# Patient Record
Sex: Male | Born: 1975 | Race: Black or African American | Hispanic: No | Marital: Single | Smoking: Former smoker
Health system: Southern US, Community
[De-identification: ages and names within clinical notes are randomized; demographics above are authoritative.]

## PROBLEM LIST (undated history)

## (undated) HISTORY — PX: ORCHIECTOMY: SHX2116

---

## 2011-06-09 ENCOUNTER — Ambulatory Visit: Payer: Self-pay | Admitting: Internal Medicine

## 2012-06-17 ENCOUNTER — Ambulatory Visit: Payer: Self-pay | Admitting: Emergency Medicine

## 2012-06-19 ENCOUNTER — Ambulatory Visit: Payer: Self-pay | Admitting: Family Medicine

## 2012-06-20 ENCOUNTER — Ambulatory Visit: Payer: Self-pay

## 2013-06-19 ENCOUNTER — Ambulatory Visit: Payer: Self-pay | Admitting: Emergency Medicine

## 2013-06-24 ENCOUNTER — Ambulatory Visit: Payer: Self-pay | Admitting: Family Medicine

## 2013-11-17 ENCOUNTER — Ambulatory Visit: Payer: Self-pay

## 2014-05-11 ENCOUNTER — Ambulatory Visit
Admit: 2014-05-11 | Disposition: A | Discharge status: Home / Self Care | Payer: Self-pay | Attending: Family Medicine | Admitting: Family Medicine

## 2014-10-25 IMAGING — CR DG SHOULDER 3+V*L*
1 series · 3 of 3 positions shown · non-contrast
Comparison: None.

CLINICAL DATA: Atraumatic left posterior scapular pain.

EXAM:
DG SHOULDER 3+VIEWS LEFT

[Series 1: grashey · 0.17mm/px · 3 of 3 slices shown]
[im 1/3]
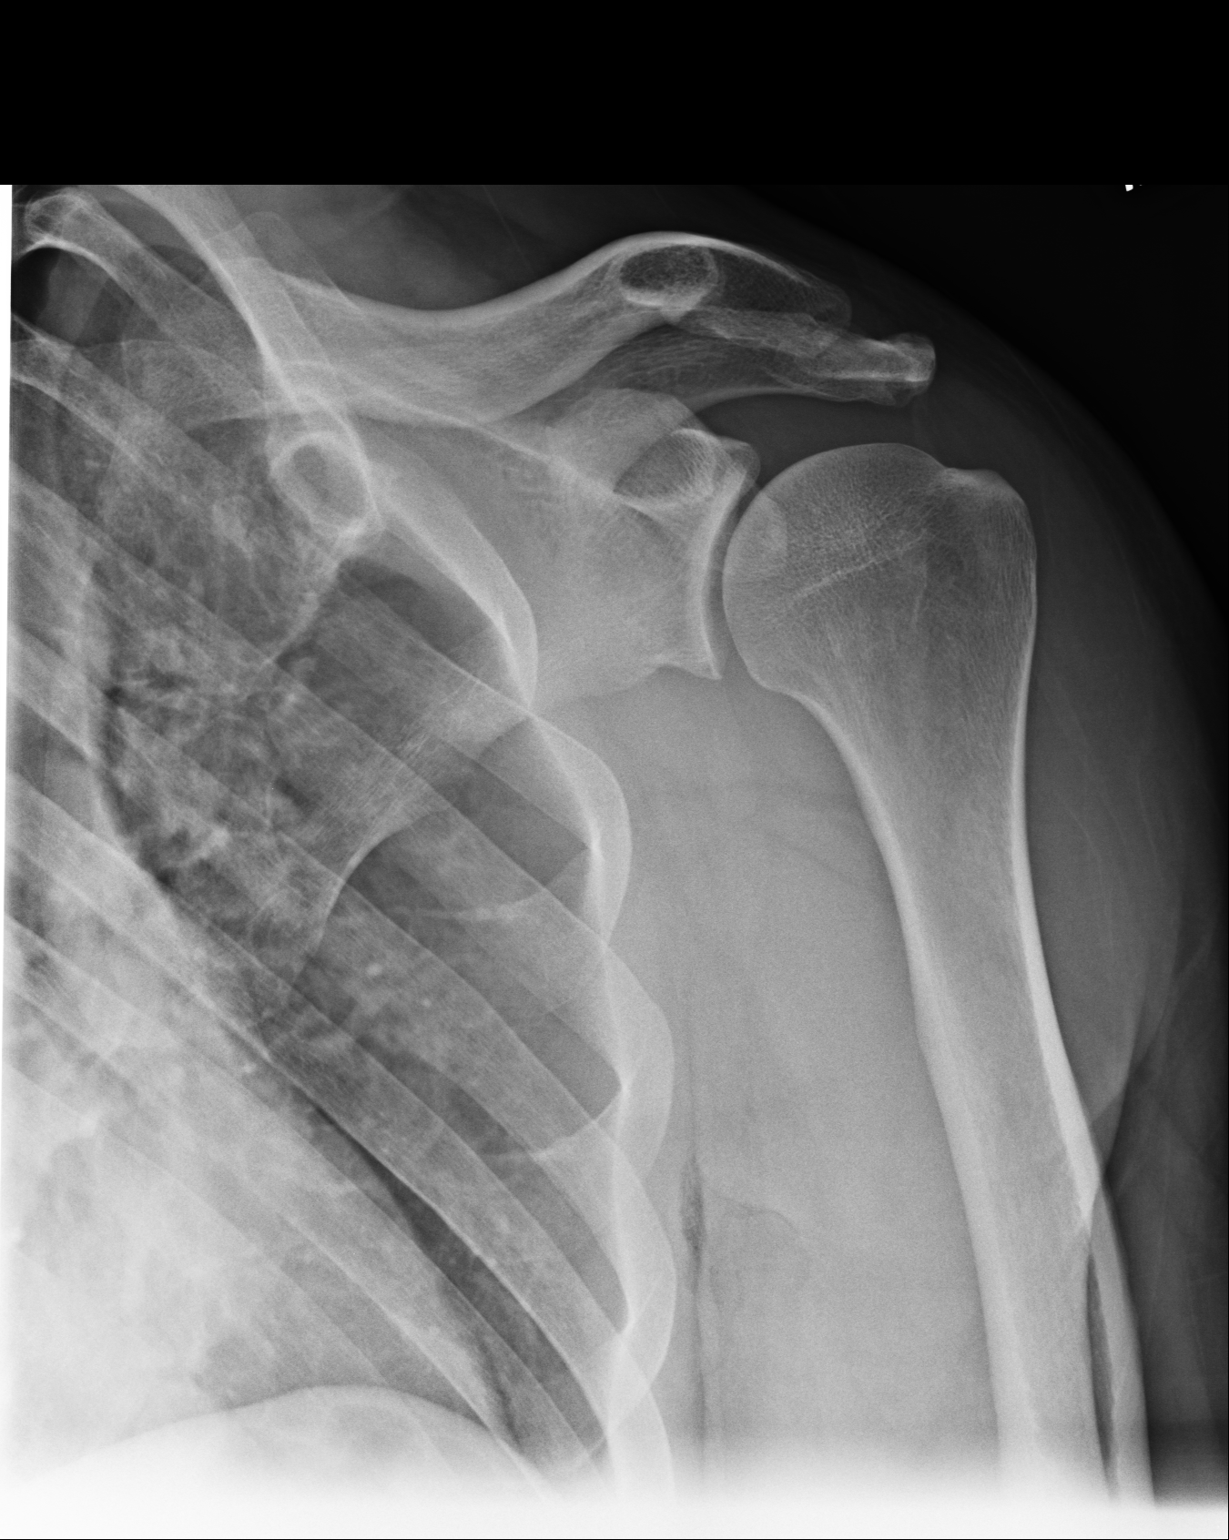
[im 2/3]
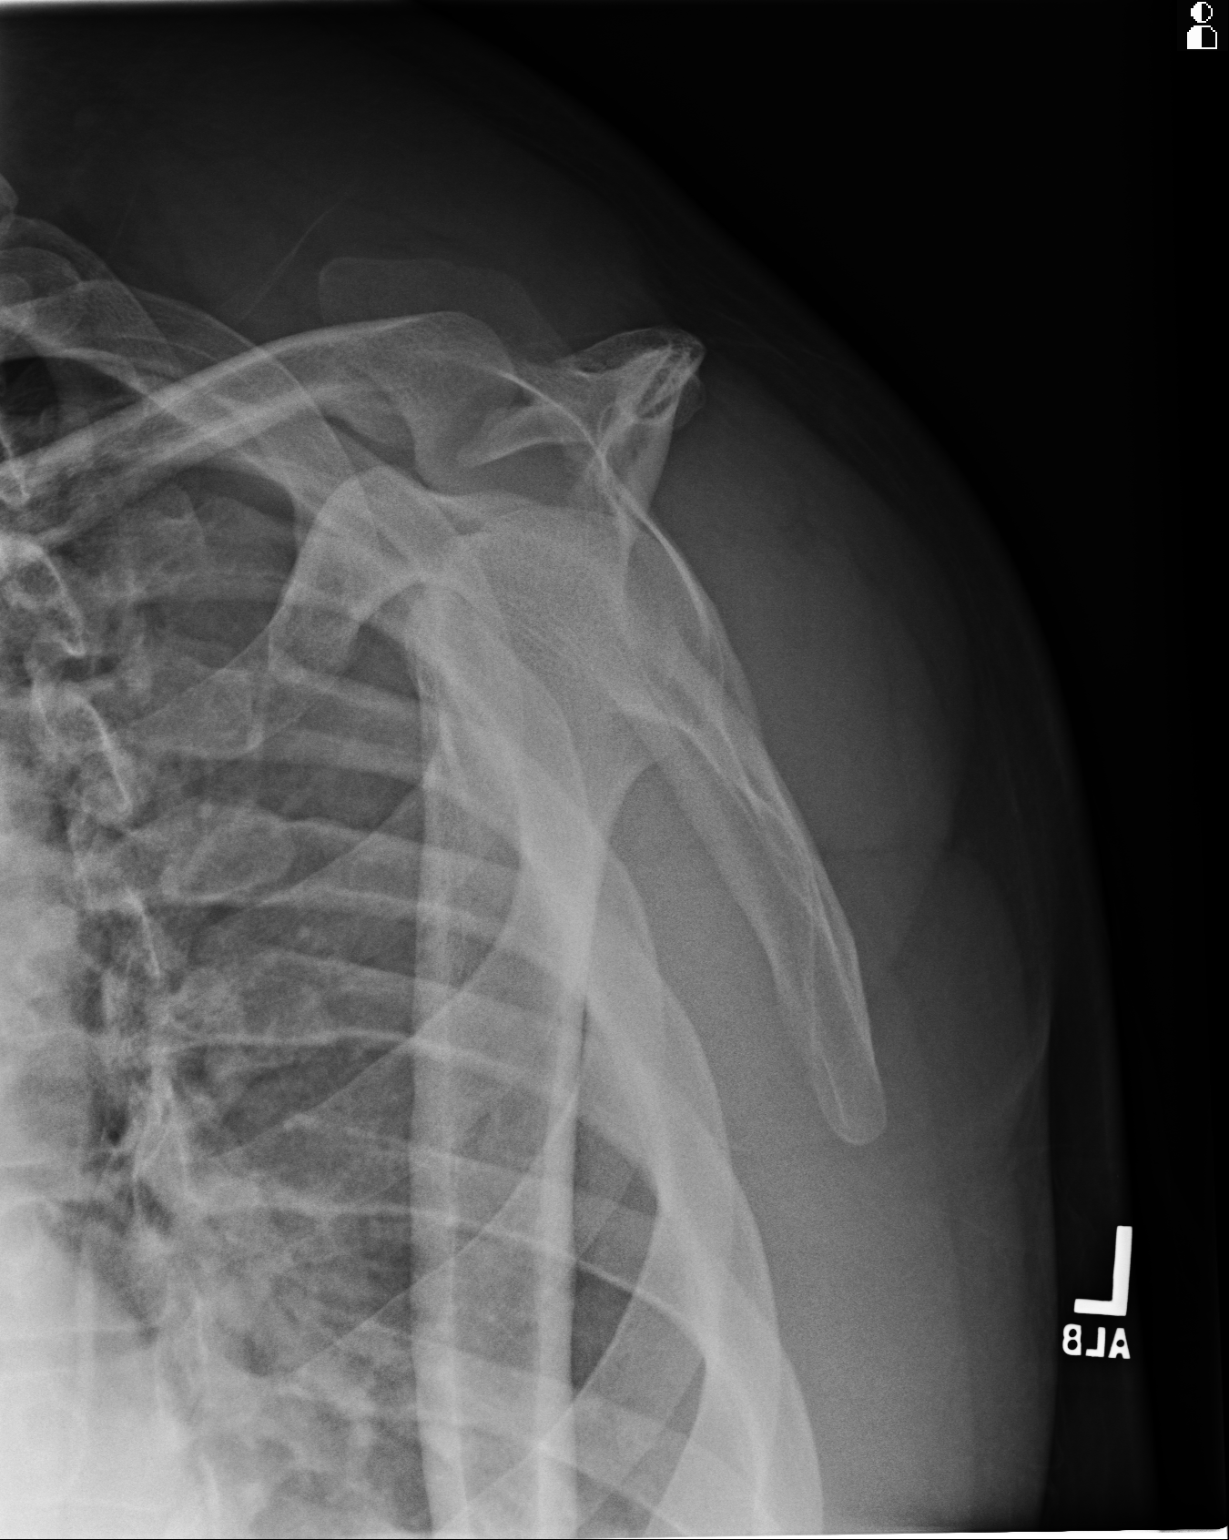
[im 3/3]
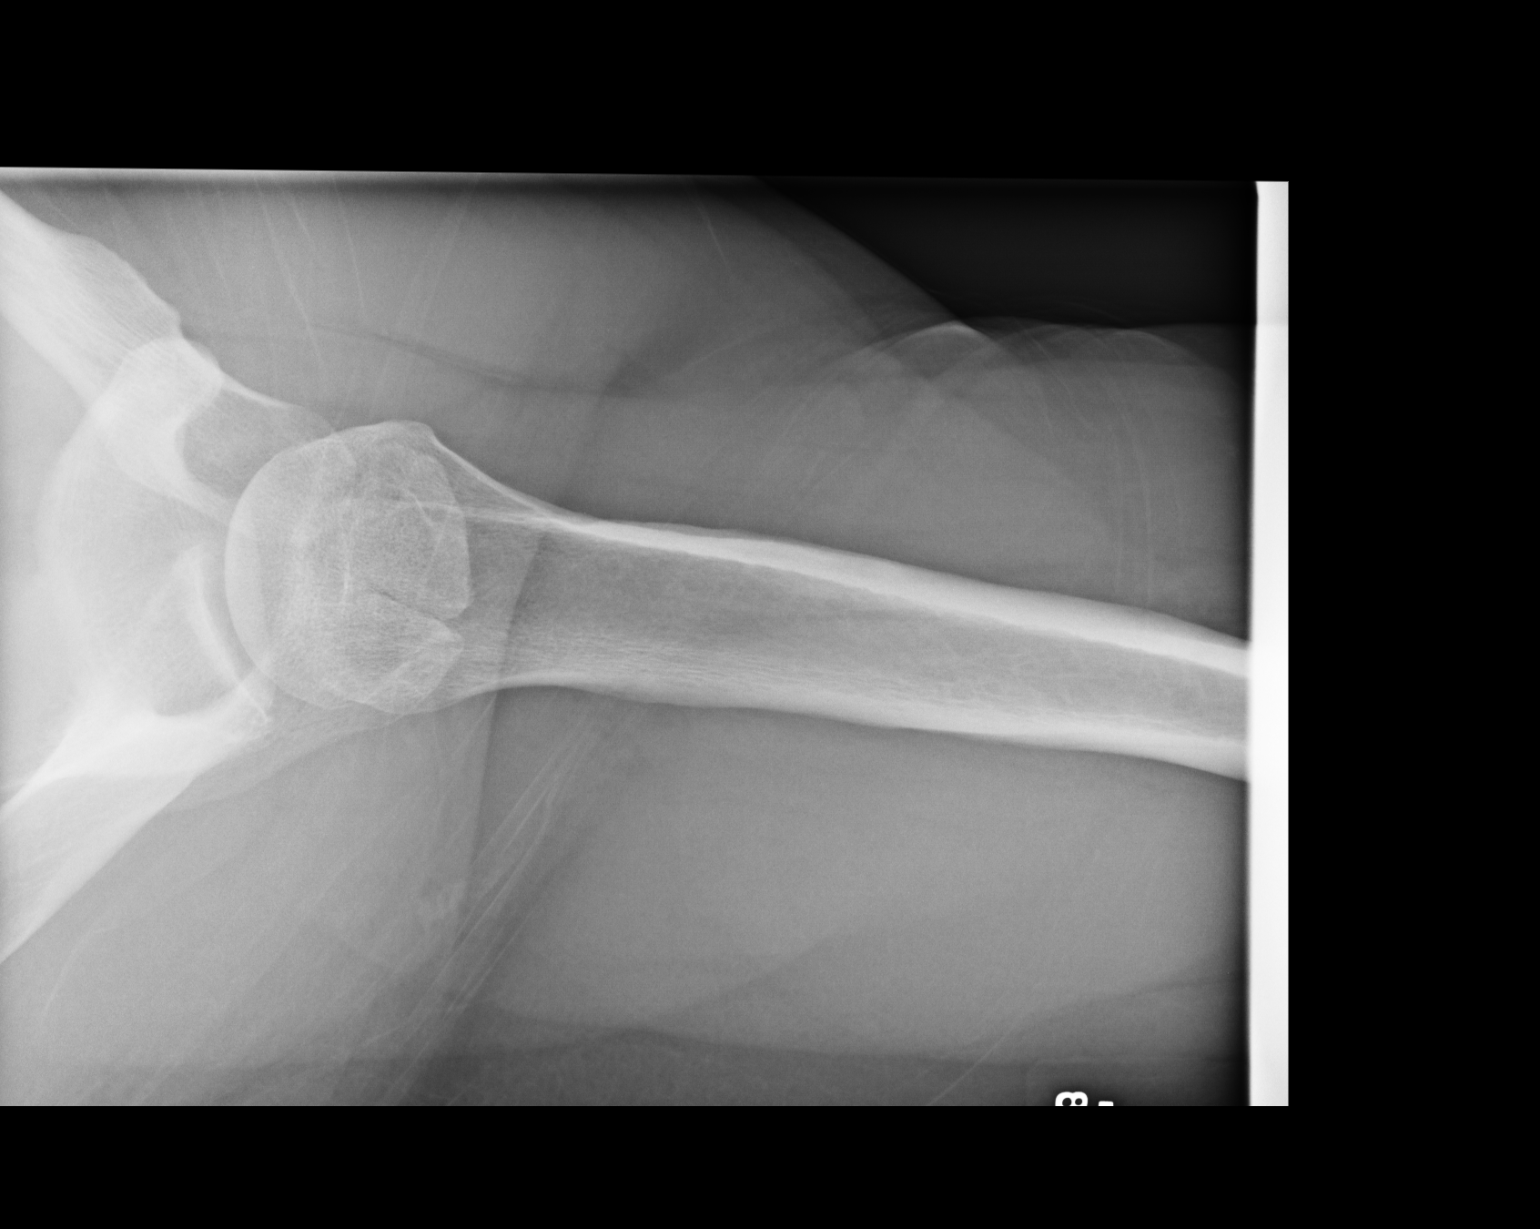

[3 of 3 positions shown; findings below may reference images not displayed]

FINDINGS: The glenohumeral joint and AC joint are normal. The body of the
scapula is intact. The observed portions of the left clavicle and
upper left ribs are normal.
IMPRESSION: There is no acute bony abnormality of the left shoulder.

## 2014-12-31 ENCOUNTER — Encounter: Payer: Self-pay | Admitting: Internal Medicine

## 2014-12-31 DIAGNOSIS — D485 Neoplasm of uncertain behavior of skin: Secondary | ICD-10-CM | POA: Insufficient documentation

## 2018-11-25 ENCOUNTER — Other Ambulatory Visit: Payer: Self-pay

## 2018-11-25 ENCOUNTER — Ambulatory Visit
Admission: EM | Admit: 2018-11-25 | Discharge: 2018-11-25 | Disposition: A | Discharge status: Home / Self Care | Payer: PRIVATE HEALTH INSURANCE | Attending: Family Medicine | Admitting: Family Medicine

## 2018-11-25 DIAGNOSIS — R05 Cough: Secondary | ICD-10-CM | POA: Diagnosis not present

## 2018-11-25 DIAGNOSIS — B349 Viral infection, unspecified: Secondary | ICD-10-CM | POA: Diagnosis not present

## 2018-11-25 DIAGNOSIS — J029 Acute pharyngitis, unspecified: Secondary | ICD-10-CM

## 2018-11-25 DIAGNOSIS — R197 Diarrhea, unspecified: Secondary | ICD-10-CM

## 2018-11-25 DIAGNOSIS — R0981 Nasal congestion: Secondary | ICD-10-CM | POA: Diagnosis not present

## 2018-11-25 MED ORDER — IPRATROPIUM BROMIDE 0.06 % NA SOLN: 2.0000 | Freq: Four times a day (QID) | NASAL | 0 refills | Status: DC | PRN

## 2018-11-25 MED ORDER — BENZONATATE 200 MG PO CAPS: 200.0000 mg | ORAL_CAPSULE | Freq: Three times a day (TID) | ORAL | 0 refills | Status: DC | PRN

## 2018-11-25 NOTE — ED Triage Notes (Addendum)
Reports since Saturday he has had productive yellow cough, nasal congestion/drainage, sore throat and diarrhea after eating.  Denies any confirmed/suspected positive COVID contacts. Pt speaks in full and complete sentences without difficulty. Pt alert and oriented X4, cooperative, RR even and unlabored, color WNL. Pt in NAD.

## 2018-11-25 NOTE — ED Provider Notes (Signed)
[facility]    CSN: [id number] Arrival date & time: 11/25/18  [id number]  History   Chief Complaint Chief Complaint  Patient presents with  . Cough  . Nasal Congestion   HPI  43 year old presents with respiratory symptoms.  Patient reports that he has been sick since Saturday.  He reports productive cough, congestion, rhinorrhea, sore throat, and diarrhea.  Denies sick contacts.  No medications or interventions tried.  He is currently in mild discomfort.  Rates his pain as 2/10 in severity.  Denies shortness of breath.  Denies fever.  No known exacerbating factors.  No other reported symptoms.  No other complaints.  PMH, Surgical Hx, Family Hx, Social History reviewed and updated as below.  PMH: Patient Active Problem List   Diagnosis Date Noted  . 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 ORCHIECTOMY Left     Home Medications    Prior to Admission medications   Medication Sig Start Date End Date Taking? Authorizing Provider  benzonatate (TESSALON) 200 MG capsule Take 1 capsule (200 mg total) by mouth 3 (three) times daily as needed for cough. 11/25/18   Thersa Salt G, DO  ipratropium (ATROVENT) 0.06 % nasal spray Place 2 sprays into both nostrils 4 (four) times daily as needed for rhinitis. 11/25/18   Coral Spikes, DO   Family History Family History  Problem Relation Age of Onset  . Sarcoidosis Mother    Social History Social History   Tobacco Use  . Smoking status: Former Smoker  Substance Use Topics  . Alcohol use: Yes    Alcohol/week: 21.0 standard drinks    Types: 21 Standard drinks or equivalent per week  . Drug use: Not on file   Allergies   Penicillins   Review of Systems Review of Systems  Constitutional: Negative for fever.  HENT: Positive for congestion, rhinorrhea and sore throat.   Respiratory: Positive for cough. Negative for shortness of breath.   Gastrointestinal:  Positive for diarrhea.   Physical Exam Triage Vital Signs ED Triage Vitals [11/25/18 0932]  Enc Vitals Group     BP 123/85     Pulse Rate 79     Resp 18     Temp 99.1 F (37.3 C)     Temp Source Oral     SpO2 98 %     Weight 225 lb (102.1 kg)     Height 5\' 11"  (1.803 m)     Head Circumference      Peak Flow      Pain Score 2     Pain Loc      Pain Edu?      Excl. in [place]?    Updated Vital Signs BP 123/85 (BP Location: Left Arm)   Pulse 79   Temp 99.1 F (37.3 C) (Oral)   Resp 18   Ht 5\' 11"  (1.803 m)   Wt 102.1 kg   SpO2 98%   BMI 31.38 kg/m   Visual Acuity Right Eye Distance:   Left Eye Distance:   Bilateral Distance:    Right Eye Near:   Left Eye Near:    Bilateral Near:     Physical Exam Vitals signs and nursing note reviewed.  Constitutional:      General: He is not in acute distress.    Appearance: Normal appearance. He is obese. He is not ill-appearing.  HENT:     Head: Normocephalic and  atraumatic.     Mouth/Throat:     Pharynx: Oropharynx is clear. No posterior oropharyngeal erythema.  Eyes:     General:        Right eye: No discharge.        Left eye: No discharge.     Conjunctiva/sclera: Conjunctivae normal.  Cardiovascular:     Rate and Rhythm: Normal rate and regular rhythm.  Pulmonary:     Effort: Pulmonary effort is normal.     Breath sounds: Wheezing present.  Neurological:     Mental Status: He is alert.  Psychiatric:        Mood and Affect: Mood normal.        Behavior: Behavior normal.    UC Treatments / Results  Labs (all labs ordered are listed, but only abnormal results are displayed) Labs Reviewed  NOVEL CORONAVIRUS, NAA (HOSP ORDER, SEND-OUT TO REF LAB; TAT 18-24 HRS)    EKG   Radiology No results found.  Procedures Procedures (including critical care time)  Medications Ordered in UC Medications - No data to display  Initial Impression / Assessment and Plan / UC Course  I have reviewed the triage vital signs  and the nursing notes.  Pertinent labs & imaging results that were available during my care of the patient were reviewed by me and considered in my medical decision making (see chart for details).    43 year old male presents with a suspected viral illness.  Awaiting Covid test results.  Treating symptomatically with Tessalon Perles and Atrovent nasal spray.  Work note given.  Supportive care.  Final Clinical Impressions(s) / UC Diagnoses   Final diagnoses:  Viral illness     Discharge Instructions     Awaiting COVID test results.  Medications as directed.  Ibuprofen for pain.  Take care  Dr. Lacinda Axon    ED Prescriptions    Medication Sig Dispense Auth. Provider   benzonatate (TESSALON) 200 MG capsule Take 1 capsule (200 mg total) by mouth 3 (three) times daily as needed for cough. 20 capsule Dazhane Villagomez G, DO   ipratropium (ATROVENT) 0.06 % nasal spray Place 2 sprays into both nostrils 4 (four) times daily as needed for rhinitis. 15 mL Coral Spikes, DO     PDMP not reviewed this encounter.   Coral Spikes, [place] 11/25/18 1233

## 2018-11-25 NOTE — Discharge Instructions (Signed)
Awaiting COVID test results.  Medications as directed.  Ibuprofen for pain.  Take care  Dr. Lacinda Axon

## 2018-11-26 LAB — NOVEL CORONAVIRUS, NAA (HOSP ORDER, SEND-OUT TO REF LAB; TAT 18-24 HRS): SARS-CoV-2, NAA: NOT DETECTED

## 2022-06-06 ENCOUNTER — Other Ambulatory Visit: Payer: Self-pay

## 2022-06-06 ENCOUNTER — Emergency Department: Payer: 59

## 2022-06-06 ENCOUNTER — Observation Stay
Admission: EM | Admit: 2022-06-06 | Discharge: 2022-06-07 | Disposition: A | Discharge status: Home / Self Care | Payer: 59 | Attending: Internal Medicine | Admitting: Internal Medicine

## 2022-06-06 DIAGNOSIS — T50901A Poisoning by unspecified drugs, medicaments and biological substances, accidental (unintentional), initial encounter: Secondary | ICD-10-CM

## 2022-06-06 DIAGNOSIS — F1990 Other psychoactive substance use, unspecified, uncomplicated: Secondary | ICD-10-CM | POA: Insufficient documentation

## 2022-06-06 DIAGNOSIS — Z87891 Personal history of nicotine dependence: Secondary | ICD-10-CM | POA: Insufficient documentation

## 2022-06-06 DIAGNOSIS — T189XXA Foreign body of alimentary tract, part unspecified, initial encounter: Secondary | ICD-10-CM | POA: Insufficient documentation

## 2022-06-06 DIAGNOSIS — Z79899 Other long term (current) drug therapy: Secondary | ICD-10-CM | POA: Insufficient documentation

## 2022-06-06 DIAGNOSIS — D751 Secondary polycythemia: Secondary | ICD-10-CM | POA: Diagnosis not present

## 2022-06-06 DIAGNOSIS — R9431 Abnormal electrocardiogram [ECG] [EKG]: Secondary | ICD-10-CM | POA: Diagnosis not present

## 2022-06-06 DIAGNOSIS — Z0389 Encounter for observation for other suspected diseases and conditions ruled out: Secondary | ICD-10-CM | POA: Diagnosis not present

## 2022-06-06 DIAGNOSIS — T50904A Poisoning by unspecified drugs, medicaments and biological substances, undetermined, initial encounter: Secondary | ICD-10-CM | POA: Diagnosis not present

## 2022-06-06 LAB — URINE DRUG SCREEN, QUALITATIVE (ARMC ONLY)
Amphetamines, Ur Screen: NOT DETECTED
Barbiturates, Ur Screen: NOT DETECTED
Benzodiazepine, Ur Scrn: NOT DETECTED
Cannabinoid 50 Ng, Ur ~~LOC~~: POSITIVE — AB
Cocaine Metabolite,Ur ~~LOC~~: POSITIVE — AB
MDMA (Ecstasy)Ur Screen: NOT DETECTED
Methadone Scn, Ur: NOT DETECTED
Opiate, Ur Screen: NOT DETECTED
Phencyclidine (PCP) Ur S: NOT DETECTED
Tricyclic, Ur Screen: NOT DETECTED

## 2022-06-06 LAB — CBC WITH DIFFERENTIAL/PLATELET
Abs Immature Granulocytes: 0.05 10*3/uL (ref 0.00–0.07)
Basophils Absolute: 0.1 10*3/uL (ref 0.0–0.1)
Basophils Relative: 1 %
Eosinophils Absolute: 0.5 10*3/uL (ref 0.0–0.5)
Eosinophils Relative: 6 %
HCT: 53.7 % — ABNORMAL HIGH (ref 39.0–52.0)
Hemoglobin: 18.6 g/dL — ABNORMAL HIGH (ref 13.0–17.0)
Immature Granulocytes: 1 %
Lymphocytes Relative: 24 %
Lymphs Abs: 2 10*3/uL (ref 0.7–4.0)
MCH: 29.2 pg (ref 26.0–34.0)
MCHC: 34.6 g/dL (ref 30.0–36.0)
MCV: 84.2 fL (ref 80.0–100.0)
Monocytes Absolute: 0.7 10*3/uL (ref 0.1–1.0)
Monocytes Relative: 8 %
Neutro Abs: 5 10*3/uL (ref 1.7–7.7)
Neutrophils Relative %: 60 %
Platelets: 269 10*3/uL (ref 150–400)
RBC: 6.38 MIL/uL — ABNORMAL HIGH (ref 4.22–5.81)
RDW: 13.8 % (ref 11.5–15.5)
WBC: 8.3 10*3/uL (ref 4.0–10.5)
nRBC: 0 % (ref 0.0–0.2)

## 2022-06-06 LAB — BASIC METABOLIC PANEL
Anion gap: 11 (ref 5–15)
BUN: 15 mg/dL (ref 6–20)
CO2: 22 mmol/L (ref 22–32)
Calcium: 9 mg/dL (ref 8.9–10.3)
Chloride: 103 mmol/L (ref 98–111)
Creatinine, Ser: 1.15 mg/dL (ref 0.61–1.24)
GFR, Estimated: >60 mL/min (ref >60)
Glucose, Bld: 110 mg/dL — ABNORMAL HIGH (ref 70–99)
Potassium: 3.8 mmol/L (ref 3.5–5.1)
Sodium: 136 mmol/L (ref 135–145)

## 2022-06-06 NOTE — ED Provider Notes (Signed)
Shared visit.  Patient arrived via PD with concern for swallowing a bag of possible crack or marijuana.  Here for medical clearance to jail.  Patient afebrile and hemodynamically stable.  X-ray imaging without foreign body.  EKG within normal limits.  UDS obtained.  Consulted and discussed with poison control who recommended 24-hour observation on telemetry.  Patient without SI or HI.  Full capacity without any psychiatric issues, do not feel that patient needs an evaluation by psychiatry at this time.  Consulted hospitalist for admission for ingestion.   Corena Herter, MD 06/06/22 1728

## 2022-06-06 NOTE — ED Notes (Signed)
Poison Control called to assess if any changes. Reported no current changes. Poison Control advised to call back if change in condition.

## 2022-06-06 NOTE — Assessment & Plan Note (Signed)
Unfortunately, patient is being monitored by police and is not able to tell us freely what he ingested or if anything at all.  At this time patient will be maintained on telemetry, I await urine toxicology screen, I will put in for neurochecks every 4 hourly.  At this time patient is clinically looking great.

## 2022-06-06 NOTE — H&P (Signed)
History and Physical    Patient: Eric Wiggins WJX:[id number] DOB: Nov 25, 1975 DOA: 06/06/2022 DOS: the patient was seen and examined on 06/06/2022 PCP: Patient, No Pcp Per  Patient coming from:  Brought in by police  Chief Complaint:  Chief Complaint  Patient presents with   Ingestion   HPI: Sadiki Lollie is a 47 y.o. male with medical history significant of orchiectomy.  All history is obtained from secondary sources with regards to events prior to patient's arrival.  Patient is not disclosing to me anything about the circumstances of this day.  Patient is seen with an officer in the room.  Patient is actually under arrest at this time.  Apparently patient was arrested by police for concern/allegation of illicit/controlled drug possession including marijuana.  The arresting officer noted that the patient had something in his mouth that the patient subsequently chewed and swallowed before the police could possess it.  Speculation is raised that this may have been a drug.  Therefore patient is brought to [facility] ER for safety evaluation.  Patient again is not revealing any details of the ingestion.  Denies suicidal or immediate homicidal ideation.  Denies any chest pain shortness of breath nausea vomiting diarrhea headache or vision changes.  Patient is feeling pretty good. Review of Systems: As mentioned in the history of present illness. All other systems reviewed and are negative. No past medical history on file. Past Surgical History:  Procedure Laterality Date   ORCHIECTOMY Left    Social History:  reports that he has quit smoking. He does not have any smokeless tobacco history on file. He reports current alcohol use of about 21.0 standard drinks of alcohol per week. No history on file for drug use.  Allergies  Allergen Reactions   Penicillins Anaphylaxis    Family History  Problem Relation Age of Onset   Sarcoidosis Mother     Prior to Admission medications    Medication Sig Start Date End Date Taking? Authorizing Provider  benzonatate (TESSALON) 200 MG capsule Take 1 capsule (200 mg total) by mouth 3 (three) times daily as needed for cough. 11/25/18   Everlene Other G, DO  ipratropium (ATROVENT) 0.06 % nasal spray Place 2 sprays into both nostrils 4 (four) times daily as needed for rhinitis. 11/25/18   Tommie Sams, DO    Physical Exam: Vitals:   06/06/22 1331 06/06/22 1332 06/06/22 1822  BP:  (!) 153/99 104/83  Pulse:  94 84  Resp:  18 17  Temp:  98.5 F (36.9 C) 98.3 F (36.8 C)  TempSrc:  Oral Oral  SpO2:  95% 97%  Weight: 102.1 kg    Height: 5\' 11"  (1.803 m)     General: Alert awake oriented x 3 no focal motor deficit no distress Respiratory exam: Bilateral intravesicular Cardiovascular exam S1-S2 normal Abdomen soft nontender Extremities warm without edema.  Officer present in room during this encounter at all times Data Reviewed:  Labs on Admission:  Results for orders placed or performed during the hospital encounter of 06/06/22 (from the past 24 hour(s))  Basic metabolic panel     Status: Abnormal   Collection Time: 06/06/22  1:41 PM  Result Value Ref Range   Sodium 136 135 - 145 mmol/L   Potassium 3.8 3.5 - 5.1 mmol/L   Chloride 103 98 - 111 mmol/L   CO2 22 22 - 32 mmol/L   Glucose, Bld 110 (H) 70 - 99 mg/dL   BUN 15 6 - 20 mg/dL  Creatinine, Ser 1.15 0.61 - 1.24 mg/dL   Calcium 9.0 8.9 - 16.1 mg/dL   GFR, Estimated >09 >60 mL/min   Anion gap 11 5 - 15  CBC with Differential     Status: Abnormal   Collection Time: 06/06/22  1:41 PM  Result Value Ref Range   WBC 8.3 4.0 - 10.5 K/uL   RBC 6.38 (H) 4.22 - 5.81 MIL/uL   Hemoglobin 18.6 (H) 13.0 - 17.0 g/dL   HCT 45.4 (H) 09.8 - 11.9 %   MCV 84.2 80.0 - 100.0 fL   MCH 29.2 26.0 - 34.0 pg   MCHC 34.6 30.0 - 36.0 g/dL   RDW 14.7 82.9 - 56.2 %   Platelets 269 150 - 400 K/uL   nRBC 0.0 0.0 - 0.2 %   Neutrophils Relative % 60 %   Neutro Abs 5.0 1.7 - 7.7 K/uL    Lymphocytes Relative 24 %   Lymphs Abs 2.0 0.7 - 4.0 K/uL   Monocytes Relative 8 %   Monocytes Absolute 0.7 0.1 - 1.0 K/uL   Eosinophils Relative 6 %   Eosinophils Absolute 0.5 0.0 - 0.5 K/uL   Basophils Relative 1 %   Basophils Absolute 0.1 0.0 - 0.1 K/uL   Immature Granulocytes 1 %   Abs Immature Granulocytes 0.05 0.00 - 0.07 K/uL   Basic Metabolic Panel: Recent Labs  Lab 06/06/22 1341  NA 136  K 3.8  CL 103  CO2 22  GLUCOSE 110*  BUN 15  CREATININE 1.15  CALCIUM 9.0   Liver Function Tests: No results for input(s): "AST", "ALT", "ALKPHOS", "BILITOT", "PROT", "ALBUMIN" in the last 168 hours. No results for input(s): "LIPASE", "AMYLASE" in the last 168 hours. No results for input(s): "AMMONIA" in the last 168 hours. CBC: Recent Labs  Lab 06/06/22 1341  WBC 8.3  NEUTROABS 5.0  HGB 18.6*  HCT 53.7*  MCV 84.2  PLT 269   Cardiac Enzymes: No results for input(s): "CKTOTAL", "CKMB", "CKMBINDEX", "TROPONINIHS" in the last 168 hours.  BNP (last 3 results) No results for input(s): "PROBNP" in the last 8760 hours. CBG: No results for input(s): "GLUCAP" in the last 168 hours.  Radiological Exams on Admission:  DG Abdomen 1 View  Result Date: 06/06/2022 CLINICAL DATA:  Possible ingestion of drugs. EXAM: ABDOMEN - 1 VIEW COMPARISON:  None Available. FINDINGS: The bowel gas pattern is normal. No radio-opaque calculi or other significant radiographic abnormality are seen. No radiopaque foreign body is noted. IMPRESSION: No definite radiopaque foreign body is noted. Electronically Signed   By: Lupita Raider M.D.   On: 06/06/2022 15:47    EKG: Independently reviewed. NSR   Assessment and Plan: * Ingestion of foreign body Unfortunately, patient is being monitored by police and is not able to tell us freely what he ingested or if anything at all.  At this time patient will be maintained on telemetry, I await urine toxicology screen, I will put in for neurochecks every 4  hourly.  At this time patient is clinically looking great.  Erythrocytosis Check erythropoietin level.  Check LFTs      Advance Care Planning:   Code Status: Full Code   Consults: none  Family Communication: per patient  Severity of Illness: The appropriate patient status for this patient is OBSERVATION. Observation status is judged to be reasonable and necessary in order to provide the required intensity of service to ensure the patient's safety. The patient's presenting symptoms, physical exam findings, and initial radiographic  and laboratory data in the context of their medical condition is felt to place them at decreased risk for further clinical deterioration. Furthermore, it is anticipated that the patient will be medically stable for discharge from the hospital within 2 midnights of admission.   Author: Nolberto Hanlon, MD 06/06/2022 8:20 PM  For on call review [email or web address].

## 2022-06-06 NOTE — ED Provider Notes (Cosign Needed Addendum)
[facility] Provider Note     Event Date/Time   First MD Initiated Contact with Patient 06/06/22 1505     (approximate)   History   Ingestion   HPI  06/06/22 1505: Patient somnolent and unable to provide HPI. He arouses to verbal stimuli, but is incoherent during conversation.  Eric Wiggins is a 47 y.o. male presents to the ED in custody of [employer] for suspected ingestion.  Patient is suspected to have swallowed a (bag) of an illegal substance, during a traffic stop. I spoke with the arresting officer via phone. The patient was witnessed to have a lump in his cheek, he began to swallow the  [employer] believes it was possibly a bag of crack, however the patient states it was just a bag of marijuana.  Patient presents in no acute distress for medical clearance.  ----------------------------------------- 4:41 PM on 06/06/2022 ----------------------------------------- Interim evaluation: patient more alert and interactive at this time.  I asked the [employer] officer to step out: of the exam room. The patient admitted to smoking crack cocaine and weed about 2 hours prior to contact with [employer]. He denies, to me, swallowing any drug products.   Physical Exam   Triage Vital Signs: ED Triage Vitals  Enc Vitals Group     BP 06/06/22 1332 (!) 153/99     Pulse Rate 06/06/22 1332 94     Resp 06/06/22 1332 18     Temp 06/06/22 1332 98.5 F (36.9 C)     Temp Source 06/06/22 1332 Oral     SpO2 06/06/22 1332 95 %     Weight 06/06/22 1331 225 lb 1.4 oz (102.1 kg)     Height 06/06/22 1331 5\' 11"  (1.803 m)     Head Circumference --      Peak Flow --      Pain Score 06/06/22 1331 0     Pain Loc --      Pain Edu? --      Excl. in [place]? --     Most recent vital signs: Vitals:   06/06/22 1332 06/06/22 1822  BP: (!) 153/99 104/83  Pulse: 94 84  Resp: 18 17  Temp: 98.5 F (36.9 C) 98.3 F (36.8 C)  SpO2: 95% 97%    General Awake, no distress.   HEENT NCAT. PERRL. EOMI. No rhinorrhea. Mucous membranes are moist.  CV:  Good peripheral perfusion. RRR RESP:  Normal effort. CTA ABD:  No distention. Soft, nontender. Normal bowel sounds   ED Results / Procedures / Treatments   Labs (all labs ordered are listed, but only abnormal results are displayed) Labs Reviewed  BASIC METABOLIC PANEL - Abnormal; Notable for the following components:      Result Value   Glucose, Bld 110 (*)    All other components within normal limits  CBC WITH DIFFERENTIAL/PLATELET - Abnormal; Notable for the following components:   RBC 6.38 (*)    Hemoglobin 18.6 (*)    HCT 53.7 (*)    All other components within normal limits  URINE DRUG SCREEN, QUALITATIVE ([facility] ONLY)     EKG  Vent. rate 94 BPM PR interval 172 ms QRS duration 74 ms QT/QTcB 354/442 ms P-R-T axes 81 64 16 Normal sinus rhythm Nonspecific T wave abnormality No STEMI  RADIOLOGY  I personally viewed and evaluated these images as part of my medical decision making, as well as reviewing the written report by the radiologist.  ED Provider Interpretation:  no acute radiopaque abnormalities  DG Abdomen 1 View  Result Date: 06/06/2022 CLINICAL DATA:  Possible ingestion of drugs. EXAM: ABDOMEN - 1 VIEW COMPARISON:  None Available. FINDINGS: The bowel gas pattern is normal. No radio-opaque calculi or other significant radiographic abnormality are seen. No radiopaque foreign body is noted. IMPRESSION: No definite radiopaque foreign body is noted. Electronically Signed   By: Lupita Raider M.D.   On: 06/06/2022 15:47     PROCEDURES:  Critical Care performed: No  Procedures   MEDICATIONS ORDERED IN ED: Medications - No data to display   IMPRESSION / MDM / ASSESSMENT AND PLAN / ED COURSE  I reviewed the triage vital signs and the nursing notes.                             Differential diagnosis includes, but is not limited to, illicit drug use, ingestion of an unknown drug,  electrolyte abnormality, dehydration, infection  Patient's presentation is most consistent with acute complicated illness / injury requiring diagnostic workup.  ----------------------------------------- 4:57 PM on 06/06/2022 ----------------------------------------- S/W Danelle Earthly, RN @ [facility]  She suggests 24 hours OBS with telemetry and with appropriate use of BZD/naloxone/charcoal, as indicated Monitor bowel movement, if appropriate Await UDS  ----------------------------------------- 7:18 PM on 06/06/2022 ----------------------------------------- S/W  Dr. Regina Eck: he will admit the patient to the hospitalist service as recommended.   Patient's diagnosis is consistent with suspected ingestion of illicit substance. Patient will be admitted to the hospital service for observation.  Patient is understandable and agreeable to the plan of admission at this time.  [employer] officer at bedside, who is the Teacher, music is also aware of the plan at this time.  Patient remained under [employer] custody while in admission/observation status.    FINAL CLINICAL IMPRESSION(S) / ED DIAGNOSES   Final diagnoses:  Ingestion of unknown drug, undetermined intent, initial encounter     Rx / DC Orders   ED Discharge Orders     None        Note:  This document was prepared using Dragon voice recognition software and may include unintentional dictation errors.    Lissa Hoard, PA-C 06/06/22 1926    Lissa Hoard, [place] 06/06/22 1952    Merwyn Katos, MD 06/07/22 [id number]

## 2022-06-06 NOTE — Assessment & Plan Note (Signed)
Check erythropoietin level.  Check LFTs

## 2022-06-06 NOTE — ED Triage Notes (Addendum)
Pt here with [employer] with an ingestion. Pt swallowed a bag of possible crack, pt states it was marijuana. Pt is here for medical clearance.

## 2022-06-07 DIAGNOSIS — F1990 Other psychoactive substance use, unspecified, uncomplicated: Secondary | ICD-10-CM | POA: Diagnosis not present

## 2022-06-07 LAB — CBC
HCT: 53.4 % — ABNORMAL HIGH (ref 39.0–52.0)
Hemoglobin: 18.1 g/dL — ABNORMAL HIGH (ref 13.0–17.0)
MCH: 28.7 pg (ref 26.0–34.0)
MCHC: 33.9 g/dL (ref 30.0–36.0)
MCV: 84.8 fL (ref 80.0–100.0)
Platelets: 234 10*3/uL (ref 150–400)
RBC: 6.3 MIL/uL — ABNORMAL HIGH (ref 4.22–5.81)
RDW: 13.9 % (ref 11.5–15.5)
WBC: 8.3 10*3/uL (ref 4.0–10.5)
nRBC: 0 % (ref 0.0–0.2)

## 2022-06-07 LAB — BASIC METABOLIC PANEL
Anion gap: 6 (ref 5–15)
BUN: 14 mg/dL (ref 6–20)
CO2: 23 mmol/L (ref 22–32)
Calcium: 8.7 mg/dL — ABNORMAL LOW (ref 8.9–10.3)
Chloride: 108 mmol/L (ref 98–111)
Creatinine, Ser: 1.05 mg/dL (ref 0.61–1.24)
GFR, Estimated: >60 mL/min (ref >60)
Glucose, Bld: 96 mg/dL (ref 70–99)
Potassium: 4 mmol/L (ref 3.5–5.1)
Sodium: 137 mmol/L (ref 135–145)

## 2022-06-07 LAB — HEPATIC FUNCTION PANEL
ALT: 24 U/L (ref 0–44)
AST: 22 U/L (ref 15–41)
Albumin: 4 g/dL (ref 3.5–5.0)
Alkaline Phosphatase: 64 U/L (ref 38–126)
Bilirubin, Direct: 0.2 mg/dL (ref 0.0–0.2)
Indirect Bilirubin: 1.1 mg/dL — ABNORMAL HIGH (ref 0.3–0.9)
Total Bilirubin: 1.3 mg/dL — ABNORMAL HIGH (ref 0.3–1.2)
Total Protein: 7.1 g/dL (ref 6.5–8.1)

## 2022-06-07 LAB — APTT: aPTT: 29 seconds (ref 24–36)

## 2022-06-07 LAB — PROTIME-INR
INR: 1.1 (ref 0.8–1.2)
Prothrombin Time: 14.6 seconds (ref 11.4–15.2)

## 2022-06-07 LAB — HIV ANTIBODY (ROUTINE TESTING W REFLEX): HIV Screen 4th Generation wRfx: NONREACTIVE

## 2022-06-07 MED ORDER — ACETAMINOPHEN 325 MG PO TABS: 650.0000 mg | ORAL_TABLET | Freq: Four times a day (QID) | ORAL | Status: DC | PRN

## 2022-06-07 MED ORDER — ENOXAPARIN SODIUM 60 MG/0.6ML IJ SOSY
0.5000 mg/kg | PREFILLED_SYRINGE | INTRAMUSCULAR | Status: DC
  Filled 2022-06-07: qty 0.6

## 2022-06-07 MED ORDER — ACETAMINOPHEN 650 MG RE SUPP: 650.0000 mg | Freq: Four times a day (QID) | RECTAL | Status: DC | PRN

## 2022-06-07 NOTE — Discharge Summary (Signed)
  Physician Discharge Summary   Patient: Eric Wiggins MRN: [id number] DOB: 1975/08/18  Admit date:     06/06/2022  Discharge date: 06/07/22  Discharge Physician: Loyce Dys   PCP: Patient, No Pcp Per   Recommendations at discharge:  Avoidance of drug use  Discharge Diagnoses: Ingestion of foreign body thought to be due to illicit drug Polysubstance use Erythrocytosis  Hospital Course:   Lewell Berdecia is a 47 y.o. male with medical history significant of orchiectomy.  History was obtained from secondary sources with regards to events prior to patient's arrival.  Patient is not disclosing to anything about the circumstances.  Patient is seen with an officer in the room.  Patient is actually under arrest at this time.   Apparently patient was arrested by police for concern/allegation of illicit/controlled drug possession including marijuana.  The arresting officer noted that the patient had something in his mouth that the patient subsequently chewed and swallowed before the police could possess it.  Speculation is raised that this may have been a drug.  Urine drug screen was positive for cocaine as well as marijuana.  Patient currently has no complaints back to his baseline and therefore being discharged today as of office visit today.  Has been counseled on cessation of drug use  Consultants: None Procedures performed: None Disposition:  Discharged in the care of police officers Diet recommendation:  Discharge Diet Orders (From admission, onward)     Start     Ordered   06/07/22 0000  Diet - low sodium heart healthy        06/07/22 1202           Regular diet DISCHARGE MEDICATION: Allergies as of 06/07/2022       Reactions   Penicillins Anaphylaxis        Medication List     STOP taking these medications    benzonatate 200 MG capsule Commonly known as: TESSALON   ipratropium 0.06 % nasal spray Commonly known as: ATROVENT        Discharge  Exam: Filed Weights   06/06/22 1331  Weight: 102.1 kg   General: Alert awake oriented x 3 no focal motor deficit no distress Respiratory exam: Bilateral intravesicular Cardiovascular exam S1-S2 normal Abdomen soft nontender Extremities warm without edema.  Condition at discharge: good\Discharge time spent: greater than 30 minutes.  Signed: Loyce Dys, MD [facility] 06/07/2022

## 2022-06-07 NOTE — ED Notes (Signed)
Hospitalist at bedside 

## 2022-06-07 NOTE — ED Notes (Signed)
Per hospitalist dr. Meriam Sprague, will discharge pt today.

## 2022-06-07 NOTE — Progress Notes (Signed)
Anticoagulation monitoring(Lovenox):  47 yo male ordered Lovenox 40 mg Q24h    Filed Weights   06/06/22 1331  Weight: 102.1 kg (225 lb 1.4 oz)   BMI  31.4  Lab Results  Component Value Date   CREATININE 1.15 06/06/2022   Estimated Creatinine Clearance: 98.7 mL/min (by C-G formula based on SCr of 1.15 mg/dL). Hemoglobin & Hematocrit     Component Value Date/Time   HGB 18.6 (H) 06/06/2022 1341   HCT 53.7 (H) 06/06/2022 1341     Per Protocol for Patient with estCrcl > 30 ml/min and BMI > 30, will transition to Lovenox 45 mg Q24h.

## 2022-06-07 NOTE — ED Notes (Signed)
Report to john, rn

## 2022-06-07 NOTE — ED Notes (Signed)
Pt consumed 100% of meal tray

## 2022-06-08 LAB — ERYTHROPOIETIN: Erythropoietin: 5.7 m[IU]/mL (ref 2.6–18.5)

## 2022-06-23 ENCOUNTER — Telehealth: Payer: Self-pay | Admitting: Family Medicine

## 2022-06-23 NOTE — Telephone Encounter (Signed)
Pt  calls & wants to schedule an STI appt; he is having burning, painful urination, no other symptoms. Please advise if there is a work-in appt available

## 2023-02-18 ENCOUNTER — Emergency Department: Payer: 59

## 2023-02-18 ENCOUNTER — Other Ambulatory Visit: Payer: Self-pay

## 2023-02-18 ENCOUNTER — Observation Stay
Admission: EM | Admit: 2023-02-18 | Discharge: 2023-02-19 | Disposition: A | Discharge status: Home / Self Care | Payer: 59 | Attending: Internal Medicine | Admitting: Internal Medicine

## 2023-02-18 DIAGNOSIS — J101 Influenza due to other identified influenza virus with other respiratory manifestations: Secondary | ICD-10-CM

## 2023-02-18 DIAGNOSIS — R0902 Hypoxemia: Secondary | ICD-10-CM | POA: Diagnosis not present

## 2023-02-18 DIAGNOSIS — J1000 Influenza due to other identified influenza virus with unspecified type of pneumonia: Principal | ICD-10-CM | POA: Insufficient documentation

## 2023-02-18 DIAGNOSIS — Z87891 Personal history of nicotine dependence: Secondary | ICD-10-CM | POA: Diagnosis not present

## 2023-02-18 DIAGNOSIS — Z1152 Encounter for screening for COVID-19: Secondary | ICD-10-CM | POA: Insufficient documentation

## 2023-02-18 DIAGNOSIS — Z6832 Body mass index (BMI) 32.0-32.9, adult: Secondary | ICD-10-CM | POA: Diagnosis not present

## 2023-02-18 DIAGNOSIS — A419 Sepsis, unspecified organism: Principal | ICD-10-CM | POA: Insufficient documentation

## 2023-02-18 DIAGNOSIS — E669 Obesity, unspecified: Secondary | ICD-10-CM | POA: Insufficient documentation

## 2023-02-18 DIAGNOSIS — J09X1 Influenza due to identified novel influenza A virus with pneumonia: Secondary | ICD-10-CM | POA: Diagnosis not present

## 2023-02-18 DIAGNOSIS — F17200 Nicotine dependence, unspecified, uncomplicated: Secondary | ICD-10-CM | POA: Insufficient documentation

## 2023-02-18 DIAGNOSIS — R079 Chest pain, unspecified: Secondary | ICD-10-CM | POA: Diagnosis not present

## 2023-02-18 DIAGNOSIS — R059 Cough, unspecified: Secondary | ICD-10-CM | POA: Diagnosis present

## 2023-02-18 LAB — CBC WITH DIFFERENTIAL/PLATELET
Abs Immature Granulocytes: 0.05 10*3/uL (ref 0.00–0.07)
Basophils Absolute: 0.1 10*3/uL (ref 0.0–0.1)
Basophils Relative: 1 %
Eosinophils Absolute: 0.2 10*3/uL (ref 0.0–0.5)
Eosinophils Relative: 2 %
HCT: 42.4 % (ref 39.0–52.0)
Hemoglobin: 15.1 g/dL (ref 13.0–17.0)
Immature Granulocytes: 1 %
Lymphocytes Relative: 9 %
Lymphs Abs: 0.9 10*3/uL (ref 0.7–4.0)
MCH: 28.7 pg (ref 26.0–34.0)
MCHC: 35.6 g/dL (ref 30.0–36.0)
MCV: 80.5 fL (ref 80.0–100.0)
Monocytes Absolute: 1.1 10*3/uL — ABNORMAL HIGH (ref 0.1–1.0)
Monocytes Relative: 11 %
Neutro Abs: 7.9 10*3/uL — ABNORMAL HIGH (ref 1.7–7.7)
Neutrophils Relative %: 76 %
Platelets: 283 10*3/uL (ref 150–400)
RBC: 5.27 MIL/uL (ref 4.22–5.81)
RDW: 13.9 % (ref 11.5–15.5)
WBC: 10.3 10*3/uL (ref 4.0–10.5)
nRBC: 0 % (ref 0.0–0.2)

## 2023-02-18 LAB — BASIC METABOLIC PANEL
Anion gap: 11 (ref 5–15)
BUN: 17 mg/dL (ref 6–20)
CO2: 21 mmol/L — ABNORMAL LOW (ref 22–32)
Calcium: 8.1 mg/dL — ABNORMAL LOW (ref 8.9–10.3)
Chloride: 103 mmol/L (ref 98–111)
Creatinine, Ser: 0.98 mg/dL (ref 0.61–1.24)
GFR, Estimated: >60 mL/min (ref >60)
Glucose, Bld: 122 mg/dL — ABNORMAL HIGH (ref 70–99)
Potassium: 4 mmol/L (ref 3.5–5.1)
Sodium: 135 mmol/L (ref 135–145)

## 2023-02-18 LAB — RESP PANEL BY RT-PCR (RSV, FLU A&B, COVID)  RVPGX2
Influenza A by PCR: POSITIVE — AB
Influenza B by PCR: NEGATIVE
Resp Syncytial Virus by PCR: NEGATIVE
SARS Coronavirus 2 by RT PCR: NEGATIVE

## 2023-02-18 LAB — TROPONIN I (HIGH SENSITIVITY)
Troponin I (High Sensitivity): 13 ng/L (ref <18)
Troponin I (High Sensitivity): 13 ng/L (ref <18)

## 2023-02-18 LAB — PROCALCITONIN: Procalcitonin: 0.63 ng/mL

## 2023-02-18 MED ORDER — SODIUM CHLORIDE 0.9 % IV BOLUS
500.0000 mL | Freq: Once | INTRAVENOUS | Status: AC
  Administered 2023-02-18: 500 mL via INTRAVENOUS

## 2023-02-18 MED ORDER — GUAIFENESIN ER 600 MG PO TB12
600.0000 mg | ORAL_TABLET | Freq: Two times a day (BID) | ORAL | Status: DC
  Administered 2023-02-18 – 2023-02-19 (×2): 600 mg via ORAL
  Filled 2023-02-18 (×2): qty 1

## 2023-02-18 MED ORDER — ONDANSETRON HCL 4 MG/2ML IJ SOLN: 4.0000 mg | Freq: Four times a day (QID) | INTRAMUSCULAR | Status: DC | PRN

## 2023-02-18 MED ORDER — PREDNISONE 20 MG PO TABS
40.0000 mg | ORAL_TABLET | Freq: Every day | ORAL | Status: DC
  Administered 2023-02-19: 40 mg via ORAL
  Filled 2023-02-18: qty 2

## 2023-02-18 MED ORDER — ENOXAPARIN SODIUM 60 MG/0.6ML IJ SOSY
0.5000 mg/kg | PREFILLED_SYRINGE | INTRAMUSCULAR | Status: DC
  Administered 2023-02-18: 52.5 mg via SUBCUTANEOUS
  Filled 2023-02-18: qty 0.6

## 2023-02-18 MED ORDER — ENOXAPARIN SODIUM 40 MG/0.4ML IJ SOSY: 40.0000 mg | PREFILLED_SYRINGE | INTRAMUSCULAR | Status: DC

## 2023-02-18 MED ORDER — ACETAMINOPHEN 325 MG PO TABS
650.0000 mg | ORAL_TABLET | Freq: Once | ORAL | Status: AC
  Administered 2023-02-18: 650 mg via ORAL
  Filled 2023-02-18: qty 2

## 2023-02-18 MED ORDER — OSELTAMIVIR PHOSPHATE 75 MG PO CAPS
75.0000 mg | ORAL_CAPSULE | Freq: Two times a day (BID) | ORAL | Status: DC
  Administered 2023-02-18 – 2023-02-19 (×2): 75 mg via ORAL
  Filled 2023-02-18 (×3): qty 1

## 2023-02-18 MED ORDER — IPRATROPIUM-ALBUTEROL 0.5-2.5 (3) MG/3ML IN SOLN
3.0000 mL | Freq: Once | RESPIRATORY_TRACT | Status: AC
  Administered 2023-02-18: 3 mL via RESPIRATORY_TRACT
  Filled 2023-02-18: qty 3

## 2023-02-18 MED ORDER — IBUPROFEN 600 MG PO TABS
600.0000 mg | ORAL_TABLET | Freq: Once | ORAL | Status: AC
  Administered 2023-02-18: 600 mg via ORAL
  Filled 2023-02-18: qty 1

## 2023-02-18 MED ORDER — BENZONATATE 100 MG PO CAPS
200.0000 mg | ORAL_CAPSULE | Freq: Three times a day (TID) | ORAL | Status: DC
  Administered 2023-02-18: 200 mg via ORAL
  Filled 2023-02-18: qty 2

## 2023-02-18 MED ORDER — AZITHROMYCIN 500 MG PO TABS
500.0000 mg | ORAL_TABLET | Freq: Once | ORAL | Status: AC
  Administered 2023-02-18: 500 mg via ORAL
  Filled 2023-02-18: qty 1

## 2023-02-18 MED ORDER — SODIUM CHLORIDE 0.9 % IV BOLUS
1000.0000 mL | Freq: Once | INTRAVENOUS | Status: AC
  Administered 2023-02-18: 1000 mL via INTRAVENOUS

## 2023-02-18 MED ORDER — ACETAMINOPHEN 325 MG PO TABS: 650.0000 mg | ORAL_TABLET | Freq: Four times a day (QID) | ORAL | Status: DC | PRN

## 2023-02-18 MED ORDER — ALBUTEROL SULFATE (2.5 MG/3ML) 0.083% IN NEBU: 2.5000 mg | INHALATION_SOLUTION | RESPIRATORY_TRACT | Status: DC | PRN

## 2023-02-18 MED ORDER — ONDANSETRON HCL 4 MG PO TABS: 4.0000 mg | ORAL_TABLET | Freq: Four times a day (QID) | ORAL | Status: DC | PRN

## 2023-02-18 MED ORDER — NICOTINE 14 MG/24HR TD PT24
14.0000 mg | MEDICATED_PATCH | Freq: Every day | TRANSDERMAL | Status: DC
  Administered 2023-02-18 – 2023-02-19 (×2): 14 mg via TRANSDERMAL
  Filled 2023-02-18 (×2): qty 1

## 2023-02-18 MED ORDER — ACETAMINOPHEN 650 MG RE SUPP: 650.0000 mg | Freq: Four times a day (QID) | RECTAL | Status: DC | PRN

## 2023-02-18 MED ORDER — IPRATROPIUM-ALBUTEROL 0.5-2.5 (3) MG/3ML IN SOLN
3.0000 mL | Freq: Four times a day (QID) | RESPIRATORY_TRACT | Status: DC
  Administered 2023-02-18 – 2023-02-19 (×2): 3 mL via RESPIRATORY_TRACT
  Filled 2023-02-18 (×2): qty 3

## 2023-02-18 MED ORDER — IBUPROFEN 400 MG PO TABS
600.0000 mg | ORAL_TABLET | Freq: Four times a day (QID) | ORAL | Status: DC | PRN
  Administered 2023-02-18: 600 mg via ORAL
  Filled 2023-02-18: qty 2

## 2023-02-18 MED ORDER — METHYLPREDNISOLONE SODIUM SUCC 125 MG IJ SOLR
125.0000 mg | Freq: Once | INTRAMUSCULAR | Status: AC
  Administered 2023-02-18: 125 mg via INTRAVENOUS
  Filled 2023-02-18: qty 2

## 2023-02-18 MED ORDER — LACTATED RINGERS IV SOLN
150.0000 mL/h | INTRAVENOUS | Status: AC
  Administered 2023-02-18 – 2023-02-19 (×2): 150 mL/h via INTRAVENOUS

## 2023-02-18 NOTE — H&P (Signed)
History and Physical    Patient: Eric Wiggins ZOX:[id number] DOB: 02-09-1975 DOA: 02/18/2023 DOS: the patient was seen and examined on 02/18/2023 PCP: Patient, No Pcp Per  Patient coming from: Home  Chief Complaint:  Chief Complaint  Patient presents with   Chest Pain   Cough   Shortness of Breath    HPI: Eric Wiggins is a 48 y.o. male with medical history significant for No significant past medical history presents to the ED with a several day history of cough and shortness of breath with known contact with others who have been sick.  He has had subjective fevers and chills.  Endorses chest pain with coughing. ED course: Tmax 103 and tachycardic to 122 tachypneic to 22 BP 142/85 O2 sat dipping to 90 on room air for which she was placed on 2 L. Positive influenza A, normal CBC.  BMP unremarkable EKG, personally reviewed and interpreted showing sinus tachycardia at 118 with no acute ST-T wave changes.  Chest x-ray suggesting viral or atypical pneumonia. Patient treated with DuoNebs methylprednisolone and a fluid bolus Started on azithromycin for possible atypical pneumonia-has PCN allergy Hospitalist consulted for admission.   Review of Systems: As mentioned in the history of present illness. All other systems reviewed and are negative.  No past medical history on file. Past Surgical History:  Procedure Laterality Date   ORCHIECTOMY Left    Social History:  reports that he has quit smoking. He does not have any smokeless tobacco history on file. He reports that he does not currently use alcohol after a past usage of about 21.0 standard drinks of alcohol per week. He reports that he does not use drugs.  Allergies  Allergen Reactions   Penicillins Anaphylaxis    Family History  Problem Relation Age of Onset   Sarcoidosis Mother     Prior to Admission medications   Not on File    Physical Exam: Vitals:   02/18/23 1456 02/18/23 1700 02/18/23 1730 02/18/23  1830  BP:  138/80 126/77 125/84  Pulse:  (!) 122 95 (!) 103  Resp:  (!) 22  20  Temp: (!) 100.6 F (38.1 C)   99.9 F (37.7 C)  TempSrc: Oral   Oral  SpO2:  96% 90% 95%  Weight:      Height:       Physical Exam Vitals and nursing note reviewed.  Constitutional:      General: He is not in acute distress. HENT:     Head: Normocephalic and atraumatic.  Cardiovascular:     Rate and Rhythm: Regular rhythm. Tachycardia present.     Heart sounds: Normal heart sounds.  Pulmonary:     Effort: Tachypnea present.     Breath sounds: Normal breath sounds.  Abdominal:     Palpations: Abdomen is soft.     Tenderness: There is no abdominal tenderness.  Neurological:     Mental Status: Mental status is at baseline.     Labs on Admission: I have personally reviewed following labs and imaging studies  CBC: Recent Labs  Lab 02/18/23 1330  WBC 10.3  NEUTROABS 7.9*  HGB 15.1  HCT 42.4  MCV 80.5  PLT 283   Basic Metabolic Panel: Recent Labs  Lab 02/18/23 1427  NA 135  K 4.0  CL 103  CO2 21*  GLUCOSE 122*  BUN 17  CREATININE 0.98  CALCIUM 8.1*   GFR: Estimated Creatinine Clearance: 117 mL/min (by C-G formula based on SCr of 0.98 mg/dL).  Liver Function Tests: No results for input(s): "AST", "ALT", "ALKPHOS", "BILITOT", "PROT", "ALBUMIN" in the last 168 hours. No results for input(s): "LIPASE", "AMYLASE" in the last 168 hours. No results for input(s): "AMMONIA" in the last 168 hours. Coagulation Profile: No results for input(s): "INR", "PROTIME" in the last 168 hours. Cardiac Enzymes: No results for input(s): "CKTOTAL", "CKMB", "CKMBINDEX", "TROPONINI" in the last 168 hours. BNP (last 3 results) No results for input(s): "PROBNP" in the last 8760 hours. HbA1C: No results for input(s): "HGBA1C" in the last 72 hours. CBG: No results for input(s): "GLUCAP" in the last 168 hours. Lipid Profile: No results for input(s): "CHOL", "HDL", "LDLCALC", "TRIG", "CHOLHDL",  "LDLDIRECT" in the last 72 hours. Thyroid Function Tests: No results for input(s): "TSH", "T4TOTAL", "FREET4", "T3FREE", "THYROIDAB" in the last 72 hours. Anemia Panel: No results for input(s): "VITAMINB12", "FOLATE", "FERRITIN", "TIBC", "IRON", "RETICCTPCT" in the last 72 hours. Urine analysis: No results found for: "COLORURINE", "APPEARANCEUR", "LABSPEC", "PHURINE", "GLUCOSEU", "HGBUR", "BILIRUBINUR", "KETONESUR", "PROTEINUR", "UROBILINOGEN", "NITRITE", "LEUKOCYTESUR"  Radiological Exams on Admission: DG Chest 2 View Result Date: 02/18/2023 CLINICAL DATA:  Cough and chest pain. Shortness of breath and fever. EXAM: CHEST - 2 VIEW COMPARISON:  None Available. FINDINGS: Trachea is midline. Heart size normal. Perihilar interstitial prominence and indistinctness. No airspace consolidation or pleural fluid. IMPRESSION: Perihilar interstitial prominence and indistinctness can be seen with a viral or atypical pneumonia. Electronically Signed   By: Leanna Battles M.D.   On: 02/18/2023 13:56     Data Reviewed: Relevant notes from primary care and specialist visits, past discharge summaries as available in EHR, including Care Everywhere. Prior diagnostic testing as pertinent to current admission diagnoses Updated medications and problem lists for reconciliation ED course, including vitals, labs, imaging, treatment and response to treatment Triage notes, nursing and pharmacy notes and ED provider's notes Notable results as noted in HPI   Assessment and Plan: * Influenza A with pneumonia Sepsis Mild hypoxia Patient was febrile, tachycardic and tachypneic with O2 sat 90 on room air, influenza A+ and infiltrate on chest x-ray Tamiflu.  Holding off on antibiotics unless procalcitonin elevated Sepsis fluids Antitussives,  Scheduled and as needed nebulizers and oral steroids-as patient had a component of wheezing Supplemental oxygen Symptom control   Chest pain Suspect noncardiac, likely related  to coughing Troponin was 12 and EKG was nonacute Tylenol/ibuprofen as needed and continue to monitor  Tobacco use disorder Nicotine patch         DVT prophylaxis: Lovenox  Consults: none  Advance Care Planning:   Code Status: Prior   Family Communication: none  Disposition Plan: Back to previous home environment  Severity of Illness: The appropriate patient status for this patient is INPATIENT. Inpatient status is judged to be reasonable and necessary in order to provide the required intensity of service to ensure the patient's safety. The patient's presenting symptoms, physical exam findings, and initial radiographic and laboratory data in the context of their chronic comorbidities is felt to place them at high risk for further clinical deterioration. Furthermore, it is not anticipated that the patient will be medically stable for discharge from the hospital within 2 midnights of admission.   * I certify that at the point of admission it is my clinical judgment that the patient will require inpatient hospital care spanning beyond 2 midnights from the point of admission due to high intensity of service, high risk for further deterioration and high frequency of surveillance required.*  Author: Andris Baumann, MD 02/18/2023 8:43 PM  For on call review [email or web address].

## 2023-02-18 NOTE — ED Notes (Signed)
Pt placed on 2L NC per provider request

## 2023-02-18 NOTE — Progress Notes (Signed)
PHARMACIST - PHYSICIAN COMMUNICATION  CONCERNING:  Enoxaparin (Lovenox) for DVT Prophylaxis    RECOMMENDATION: Patient was prescribed enoxaprin 40mg  q24 hours for VTE prophylaxis.   Filed Weights   02/18/23 1330  Weight: 104.3 kg (230 lb)    Body mass index is 32.08 kg/m.  Estimated Creatinine Clearance: 117 mL/min (by C-G formula based on SCr of 0.98 mg/dL).   Based on [facility] policy patient is candidate for enoxaparin 0.5mg /kg TBW SQ every 24 hours based on BMI being >30.  DESCRIPTION: Pharmacy has adjusted enoxaparin dose per [facility] policy.  Patient is now receiving enoxaparin 52.5 mg every 24 hours    Merryl Hacker, PharmD Clinical Pharmacist  02/18/2023 8:55 PM

## 2023-02-18 NOTE — Assessment & Plan Note (Addendum)
Sepsis Mild hypoxia Patient was febrile, tachycardic and tachypneic with O2 sat 90 on room air, influenza A+ and infiltrate on chest x-ray Tamiflu.  Holding off on antibiotics unless procalcitonin elevated Sepsis fluids Antitussives,  Scheduled and as needed nebulizers and oral steroids-as patient had a component of wheezing Supplemental oxygen Symptom control

## 2023-02-18 NOTE — ED Notes (Signed)
RN to bedside to introduce self to pt. Pt in mild discomfort and family at bedside.

## 2023-02-18 NOTE — ED Provider Notes (Signed)
[facility] Provider Note    Event Date/Time   First MD Initiated Contact with Patient 02/18/23 1535     (approximate)   History   Chest Pain, Cough, and Shortness of Breath   HPI  Eric Wiggins is a 48 y.o. male presents to the ER for evaluation of generalized malaise shortness of breath cough congestion muscle aches for the past few days.  Patient did not get his flu shot this year.  Has had some nausea and decreased p.o. intake.  Is also complaining of some headaches.  Does not wear home oxygen.  He does smoke but denies any history of bronchitis or COPD.     Physical Exam   Triage Vital Signs: ED Triage Vitals  Encounter Vitals Group     BP 02/18/23 1334 (!) 142/85     Systolic BP Percentile --      Diastolic BP Percentile --      Pulse Rate 02/18/23 1334 (!) 122     Resp 02/18/23 1334 19     Temp 02/18/23 1334 (!) 103 F (39.4 C)     Temp Source 02/18/23 1456 Oral     SpO2 02/18/23 1334 92 %     Weight 02/18/23 1330 230 lb (104.3 kg)     Height 02/18/23 1330 5\' 11"  (1.803 m)     Head Circumference --      Peak Flow --      Pain Score 02/18/23 1329 10     Pain Loc --      Pain Education --      Exclude from Growth Chart --     Most recent vital signs: Vitals:   02/18/23 1730 02/18/23 1830  BP: 126/77 125/84  Pulse: 95 (!) 103  Resp:  20  Temp:  99.9 F (37.7 C)  SpO2: 90% 95%     Constitutional: Alert  Eyes: Conjunctivae are normal.  Head: Atraumatic. Nose: No congestion/rhinnorhea. Mouth/Throat: Mucous membranes are moist.   Neck: Painless ROM.  Cardiovascular:   Good peripheral circulation. Respiratory: Mild tachypnea with occasional scattered expiratory wheeze.  No crackles. Gastrointestinal: Soft and nontender.  Musculoskeletal:  no deformity Neurologic:  MAE spontaneously. No gross focal neurologic deficits are appreciated.  Skin:  Skin is warm, dry and intact. No rash noted. Psychiatric: Mood and affect are  normal. Speech and behavior are normal.    ED Results / Procedures / Treatments   Labs (all labs ordered are listed, but only abnormal results are displayed) Labs Reviewed  RESP PANEL BY RT-PCR (RSV, FLU A&B, COVID)  RVPGX2 - Abnormal; Notable for the following components:      Result Value   Influenza A by PCR POSITIVE (*)    All other components within normal limits  CBC WITH DIFFERENTIAL/PLATELET - Abnormal; Notable for the following components:   Neutro Abs 7.9 (*)    Monocytes Absolute 1.1 (*)    All other components within normal limits  BASIC METABOLIC PANEL - Abnormal; Notable for the following components:   CO2 21 (*)    Glucose, Bld 122 (*)    Calcium 8.1 (*)    All other components within normal limits  CULTURE, BLOOD (ROUTINE X 2)  CULTURE, BLOOD (ROUTINE X 2)  TROPONIN I (HIGH SENSITIVITY)  TROPONIN I (HIGH SENSITIVITY)     EKG ED ECG REPORT I, Willy Eddy, the attending physician, personally viewed and interpreted this ECG.   Date: 02/18/2023  EKG Time: 13:30  Rate: 120  Rhythm: sinus  Axis: normal  Intervals: normal  ST&T Change: no stemi, no depressions     RADIOLOGY Please see ED Course for my review and interpretation.  I personally reviewed all radiographic images ordered to evaluate for the above acute complaints and reviewed radiology reports and findings.  These findings were personally discussed with the patient.  Please see medical record for radiology report.    PROCEDURES:  Critical Care performed: No  Procedures   MEDICATIONS ORDERED IN ED: Medications  ipratropium-albuterol (DUONEB) 0.5-2.5 (3) MG/3ML nebulizer solution 3 mL (has no administration in time range)  azithromycin (ZITHROMAX) tablet 500 mg (has no administration in time range)  acetaminophen (TYLENOL) tablet 650 mg (650 mg Oral Given 02/18/23 1336)  ibuprofen (ADVIL) tablet 600 mg (600 mg Oral Given 02/18/23 1640)  sodium chloride 0.9 % bolus 500 mL (0 mLs  Intravenous Stopped 02/18/23 1730)  ipratropium-albuterol (DUONEB) 0.5-2.5 (3) MG/3ML nebulizer solution 3 mL (3 mLs Nebulization Given 02/18/23 1640)  methylPREDNISolone sodium succinate (SOLU-MEDROL) 125 mg/2 mL injection 125 mg (125 mg Intravenous Given 02/18/23 1647)  sodium chloride 0.9 % bolus 1,000 mL (1,000 mLs Intravenous New Bag/Given 02/18/23 1854)     IMPRESSION / MDM / ASSESSMENT AND PLAN / ED COURSE  I reviewed the triage vital signs and the nursing notes.                              Differential diagnosis includes, but is not limited to, sepsis, pneumonia, COVID, flu, RSV, COPD, bronchitis, dehydration, electrolyte abnormality  Patient presenting to the ER for evaluation of symptoms as described above.  Based on symptoms, risk factors and considered above differential, this presenting complaint could reflect a potentially life-threatening illness therefore the patient will be placed on continuous pulse oximetry and telemetry for monitoring.  Laboratory evaluation will be sent to evaluate for the above complaints.  Chest x-ray on my review and interpretation with some scattered opacities most likely consistent with flu as he is flu a positive.  He is tachycardic febrile will give IV fluids will treat fever will give nebulizer treatment and reassess.    Clinical Course as of 02/18/23 2034  Wynelle Link Feb 18, 2023  1819 Patient without any hypoxia with ambulation but still feeling weak and has not had much p.o. intake therefore I do suspect he needs additional fluids.  Have ordered additional IV fluids.  Will continue observe. [PR]  2034 Patient with persistent tachypnea [place].  Despite 2 L of fluids patient still with SIRS criteria will consult hospitalist for admission. [PR]    Clinical Course User Index [PR] Willy Eddy, MD     FINAL CLINICAL IMPRESSION(S) / ED DIAGNOSES   Final diagnoses:  Sepsis without acute organ dysfunction, due to unspecified organism [facility])  Influenza A  (H1N1)     Rx / DC Orders   ED Discharge Orders     None        Note:  This document was prepared using Dragon voice recognition software and may include unintentional dictation errors.    Willy Eddy, MD 02/18/23 2034

## 2023-02-18 NOTE — Assessment & Plan Note (Signed)
 Nicotine patch

## 2023-02-18 NOTE — ED Provider Triage Note (Signed)
Emergency Medicine Provider Triage Evaluation Note  Acel Natzke , a 48 y.o. male  was evaluated in triage.  Pt complains of cough, chest pain, sob, fever. Wife is sick with same symptoms.  Review of Systems  Positive: cough, chest pain, sob, fever Negative: Abd pain, n/v/d  Physical Exam  There were no vitals taken for this visit. Gen:   Awake, no distress   Resp:  Normal effort  MSK:   Moves extremities without difficulty  Other:    Medical Decision Making  Medically screening exam initiated at 1:29 PM.  Appropriate orders placed.  Ileana Ladd was informed that the remainder of the evaluation will be completed by another provider, this initial triage assessment does not replace that evaluation, and the importance of remaining in the ED until their evaluation is complete.     Jackelyn Hoehn, PA-C 02/18/23 1332

## 2023-02-18 NOTE — ED Notes (Signed)
Pt ambulated. Pt pulse ox remained 97% during walking. Pt weak, but able to walk.

## 2023-02-18 NOTE — ED Triage Notes (Signed)
Pt comes with cp. Sob and cough for few days. Wife reports pt has been around others that have been sick. Pt also having some left arm swelling. Pt has been have chills and possible fevers.

## 2023-02-18 NOTE — ED Notes (Addendum)
Family at [facility] Nurse desk states, pt is having a "coughing spell" and sweaty. Reports that he is diaphoretic. Explained to family that pt is sweating probably from his fever breaking. Pt states CP is getting increasingly worse. This RN did repeat VS and EKG and reassured family. This RN placed on 2L [place] for comfort only

## 2023-02-18 NOTE — Assessment & Plan Note (Addendum)
Suspect noncardiac, likely related to coughing Troponin was 12 and EKG was nonacute Tylenol/ibuprofen as needed and continue to monitor

## 2023-02-19 ENCOUNTER — Other Ambulatory Visit: Payer: Self-pay

## 2023-02-19 DIAGNOSIS — J09X1 Influenza due to identified novel influenza A virus with pneumonia: Secondary | ICD-10-CM | POA: Diagnosis not present

## 2023-02-19 LAB — HIV ANTIBODY (ROUTINE TESTING W REFLEX): HIV Screen 4th Generation wRfx: NONREACTIVE

## 2023-02-19 LAB — PROTIME-INR
INR: 1.2 (ref 0.8–1.2)
Prothrombin Time: 15.8 s — ABNORMAL HIGH (ref 11.4–15.2)

## 2023-02-19 LAB — CORTISOL-AM, BLOOD: Cortisol - AM: 5.6 ug/dL — ABNORMAL LOW (ref 6.7–22.6)

## 2023-02-19 MED ORDER — AZITHROMYCIN 500 MG PO TABS
500.0000 mg | ORAL_TABLET | Freq: Every day | ORAL | 0 refills | Status: AC
  Filled 2023-02-19: qty 3, 3d supply, fill #0

## 2023-02-19 MED ORDER — IBUPROFEN 600 MG PO TABS: 600.0000 mg | ORAL_TABLET | Freq: Four times a day (QID) | ORAL | Status: AC | PRN

## 2023-02-19 MED ORDER — ALBUTEROL SULFATE HFA 108 (90 BASE) MCG/ACT IN AERS
2.0000 | INHALATION_SPRAY | Freq: Four times a day (QID) | RESPIRATORY_TRACT | 2 refills | Status: AC | PRN
Start: 2023-02-19 — End: ?
  Filled 2023-02-19: qty 6.7, 30d supply, fill #0

## 2023-02-19 MED ORDER — OXYCODONE HCL 5 MG PO TABS
5.0000 mg | ORAL_TABLET | Freq: Three times a day (TID) | ORAL | 0 refills | Status: AC | PRN
  Filled 2023-02-19: qty 10, 4d supply, fill #0

## 2023-02-19 MED ORDER — NICOTINE 14 MG/24HR TD PT24
14.0000 mg | MEDICATED_PATCH | Freq: Every day | TRANSDERMAL | 0 refills | Status: AC
  Filled 2023-02-19: qty 28, 28d supply, fill #0

## 2023-02-19 MED ORDER — ACETAMINOPHEN 325 MG PO TABS: 650.0000 mg | ORAL_TABLET | Freq: Four times a day (QID) | ORAL | Status: AC | PRN

## 2023-02-19 MED ORDER — OSELTAMIVIR PHOSPHATE 75 MG PO CAPS
75.0000 mg | ORAL_CAPSULE | Freq: Two times a day (BID) | ORAL | 0 refills | Status: AC
  Filled 2023-02-19: qty 8, 4d supply, fill #0

## 2023-02-19 MED ORDER — GUAIFENESIN ER 600 MG PO TB12
600.0000 mg | ORAL_TABLET | Freq: Two times a day (BID) | ORAL | 0 refills | Status: AC | PRN
  Filled 2023-02-19: qty 20, 10d supply, fill #0

## 2023-02-19 MED ORDER — SODIUM CHLORIDE 0.9 % IV SOLN: 500.0000 mg | INTRAVENOUS | Status: DC

## 2023-02-19 NOTE — Evaluation (Signed)
Physical Therapy Evaluation Patient Details Name: Eric Wiggins MRN: [id number] DOB: 08-16-1975 Today's Date: 02/19/2023  History of Present Illness  Pt is a 48 y.o. male who presents to the ED with a several day history of cough and shortness of breath, subjective fevers and chills.  Endorses chest pain with coughing with known contact with others who have been sick. No significant PMHx. Admitting dx: Influenza A with pneumonia, Sepsis and Mild hypoxia.   Clinical Impression  Patient alert, agreeable to PT and mobility. Reported at baseline he is independent. He was able to mobilize today modI, and preferred to use IV pole while ambulating but not necessary. Able to to toilet modI as well. Educated on rest breaks and activity modification for the next several days due to pt SOB. HR and spO2 on room air WFLs throughout. Returned to room with needs in reach. The patient demonstrated and reported return to baseline level of functioning, no further acute PT needs indicated. PT to sign off. Please reconsult PT if pt status changes or acute needs are identified.          If plan is discharge home, recommend the following: Assist for transportation   Can travel by private vehicle        Equipment Recommendations None recommended by PT  Recommendations for Other Services       Functional Status Assessment Patient has not had a recent decline in their functional status     Precautions / Restrictions Precautions Precautions: Fall Restrictions Weight Bearing Restrictions Per Provider Order: No      Mobility  Bed Mobility Overal bed mobility: Modified Independent                  Transfers Overall transfer level: Modified independent                      Ambulation/Gait Ambulation/Gait assistance: Modified independent (Device/Increase time)             General Gait Details: pt utilized iv pole but not required. cued for a standing rest break as needed  and education on modification for the next several days  Stairs            Wheelchair Mobility     Tilt Bed    Modified Rankin (Stroke Patients Only)       Balance Overall balance assessment: Modified Independent                                           Pertinent Vitals/Pain Pain Assessment Pain Assessment: No/denies pain    Home Living Family/patient expects to be discharged to:: Private residence Living Arrangements: Spouse/significant other                      Prior Function Prior Level of Function : Independent/Modified Independent;Working/employed;Driving             Mobility Comments: works full time       Programmer, systems Upper Extremity Assessment: Overall WFL for tasks assessed    Lower Extremity Assessment Lower Extremity Assessment: Overall WFL for tasks assessed       Communication      Cognition Arousal: Alert Behavior During Therapy: WFL for tasks assessed/performed Overall Cognitive Status: Within Functional Limits for tasks assessed  General Comments      Exercises     Assessment/Plan    PT Assessment Patient does not need any further PT services  PT Problem List         PT Treatment Interventions      PT Goals (Current goals can be found in the Care Plan section)       Frequency       Co-evaluation               AM-PAC PT "6 Clicks" Mobility  Outcome Measure Help needed turning from your back to your side while in a flat bed without using bedrails?: None Help needed moving from lying on your back to sitting on the side of a flat bed without using bedrails?: None Help needed moving to and from a bed to a chair (including a wheelchair)?: None Help needed standing up from a chair using your arms (e.g., wheelchair or bedside chair)?: None Help needed to walk in hospital room?:  None Help needed climbing 3-5 steps with a railing? : None 6 Click Score: 24    End of Session   Activity Tolerance: Patient tolerated treatment well Patient left: in bed;with call bell/phone within reach Nurse Communication: Mobility status PT Visit Diagnosis: Other abnormalities of gait and mobility (R26.89)    Time: 1610-9604 PT Time Calculation (min) (ACUTE ONLY): 22 min   Charges:   PT Evaluation $PT Eval Low Complexity: 1 Low PT Treatments $Therapeutic Activity: 8-22 mins PT General Charges $$ ACUTE PT VISIT: 1 Visit         Olga Coaster PT, DPT 9:47 AM,02/19/23

## 2023-02-19 NOTE — Discharge Summary (Signed)
Physician Discharge Summary   Patient: Eric Wiggins MRN: [id number] DOB: May 11, 1975  Admit date:     02/18/2023  Discharge date: 02/19/23  Discharge Physician: Jonah Blue   PCP: Patient, No Pcp Per   Recommendations at discharge:   Continue to quarantine until symptoms are gone Take Tamiflu if tolerated until gone (5 total days) Complete antibiotics (Azithromycin for 5 total days) Other medications are for as needed use based on symptoms (Mucinex for cough, Albuterol for wheezing/chest tightness, Oxycodone for pain) You are being provided with a limited number of narcotic pain pills; do not drive or make important decisions while taking this medication STOP smoking!  Nicotine patch provided Follow up with PCP when possible   Discharge Diagnoses: Principal Problem:   Influenza A with pneumonia Active Problems:   Chest pain   Sepsis (HCC)   Hypoxia, mild   Tobacco use disorder    Hospital Course: 47yo without significant medical history who presented with fever and SIRS physiology.  +Influenza A, infiltrate on CXR.  Started on Tamiflu and Azithromycin.  Assessment and Plan:  Influenza A with pneumonia Patient was febrile, tachycardic and tachypneic with O2 sat 90 on room air, influenza A+ and infiltrate on chest x-ray Continue Tamiflu if tolerated, although he is marginal for efficacy based on duration of symptoms to date Complete 5 days of Azithromycin Symptom control with Mucinex, Albuterol, and Oxycodone    Chest pain Suspect noncardiac, likely related to coughing Troponin was 12 and EKG was nonacute Tylenol/ibuprofen as needed and continue to monitor   Tobacco use disorder Nicotine patch prescribed   Class 1 Obesity Body mass index is 32.08 kg/m.Marland Kitchen  Weight loss should be encouraged Outpatient PCP/bariatric medicine f/u encouraged     Consultants: PT  Procedures: None  Antibiotics: Azithromycin 2/2-2/6  30 Day Unplanned Readmission Risk  Score    Flowsheet Row ED to Hosp-Admission (Current) from 02/18/2023 in [facility] Emergency Department at [facility]  30 Day Unplanned Readmission Risk Score (%) 6.91 Filed at 02/19/2023 0802       This score is the patient's risk of an unplanned readmission within 30 days of being discharged (0 -100%). The score is based on dignosis, age, lab data, medications, orders, and past utilization.   Low:  0-14.9   Medium: 15-21.9   High: 22-29.9   Extreme: 30 and above          Pain control - [place] Controlled Substance Reporting System database was reviewed. and patient was instructed, not to drive, operate heavy machinery, perform activities at heights, swimming or participation in water activities or provide baby-sitting services while on Pain, Sleep and Anxiety Medications; until their outpatient Physician has advised to do so again. Also recommended to not to take more than prescribed Pain, Sleep and Anxiety Medications.    Disposition: Home Diet recommendation:  Regular diet DISCHARGE MEDICATION: Allergies as of 02/19/2023       Reactions   Penicillins Anaphylaxis        Medication List     TAKE these medications    acetaminophen 325 MG tablet Commonly known as: TYLENOL Take 2 tablets (650 mg total) by mouth every 6 (six) hours as needed for mild pain (pain score 1-3) (or Fever >/= 101).   albuterol 108 (90 Base) MCG/ACT inhaler Commonly known as: VENTOLIN HFA Inhale 2 puffs into the lungs every 6 (six) hours as needed for wheezing or shortness of breath.   azithromycin 500 MG tablet Commonly known as: Zithromax  Take 1 tablet (500 mg total) by mouth daily for 3 days. Start taking on: February 20, 2023   guaiFENesin 600 MG 12 hr tablet Commonly known as: MUCINEX Take 1 tablet (600 mg total) by mouth 2 (two) times daily as needed for cough.   ibuprofen 600 MG tablet Commonly known as: ADVIL Take 1 tablet (600 mg total) by mouth every 6 (six) hours as  needed for moderate pain (pain score 4-6).   nicotine 14 mg/24hr patch Commonly known as: NICODERM CQ - dosed in mg/24 hours Place 1 patch (14 mg total) onto the skin daily. Start taking on: February 20, 2023   oseltamivir 75 MG capsule Commonly known as: TAMIFLU Take 1 capsule (75 mg total) by mouth 2 (two) times daily for 8 doses.   oxyCODONE 5 MG immediate release tablet Commonly known as: Roxicodone Take 1 tablet (5 mg total) by mouth every 8 (eight) hours as needed for breakthrough pain.        Discharge Exam:   Subjective: Feels much better.  Able to ambulate without difficulty to the restroom with mild SOB.  Would like to go home.   Objective: Vitals:   02/19/23 0900 02/19/23 1000  BP: 135/83 (!) 143/85  Pulse: 72 87  Resp:  16  Temp:    SpO2: 98% 97%    Intake/Output Summary (Last 24 hours) at 02/19/2023 1033 Last data filed at 02/18/2023 2228 Gross per 24 hour  Intake 1980 ml  Output 840 ml  Net 1140 ml   Filed Weights   02/18/23 1330  Weight: 104.3 kg    Exam:  General:  Appears calm and comfortable and is in NAD, on RA Eyes:  EOMI, normal lids, iris ENT:  grossly normal hearing, lips & tongue, mmm Neck:  no LAD, masses or thyromegaly Cardiovascular:  RRR, no m/r/g. No LE edema.  Respiratory:   Scattered wheezes/rhonchi with good air movement.  Normal to mildly increased respiratory effort. Abdomen:  soft, NT, ND Skin:  no rash or induration seen on limited exam Musculoskeletal:  grossly normal tone BUE/BLE, good ROM, no bony abnormality Psychiatric:  grossly normal mood and affect, speech fluent and appropriate, AOx3 Neurologic:  CN 2-12 grossly intact, moves all extremities in coordinated fashion  Data Reviewed: I have reviewed the patient's lab results since admission.  Pertinent labs for today include:   Unremarkable BMP on 2/2 Normal CBC on 2/2 HS troponin 13, 13 Procalcitonin 0.63 Influenza A positive Blood cultures negative at 12  hours    Condition at discharge: improving  The results of significant diagnostics from this hospitalization (including imaging, microbiology, ancillary and laboratory) are listed below for reference.   Imaging Studies: DG Chest 2 View Result Date: 02/18/2023 CLINICAL DATA:  Cough and chest pain. Shortness of breath and fever. EXAM: CHEST - 2 VIEW COMPARISON:  None Available. FINDINGS: Trachea is midline. Heart size normal. Perihilar interstitial prominence and indistinctness. No airspace consolidation or pleural fluid. IMPRESSION: Perihilar interstitial prominence and indistinctness can be seen with a viral or atypical pneumonia. Electronically Signed   By: Leanna Battles M.D.   On: 02/18/2023 13:56    Microbiology: Results for orders placed or performed during the hospital encounter of 02/18/23  Resp panel by RT-PCR (RSV, Flu A&B, Covid) Anterior Nasal Swab     Status: Abnormal   Collection Time: 02/18/23  1:30 PM   Specimen: Anterior Nasal Swab  Result Value Ref Range Status   SARS Coronavirus 2 by RT PCR NEGATIVE NEGATIVE  Final    Comment: (NOTE) SARS-CoV-2 target nucleic acids are NOT DETECTED.  The SARS-CoV-2 RNA is generally detectable in upper respiratory specimens during the acute phase of infection. The lowest concentration of SARS-CoV-2 viral copies this assay can detect is 138 copies/mL. A negative result does not preclude SARS-Cov-2 infection and should not be used as the sole basis for treatment or other patient management decisions. A negative result may occur with  improper specimen collection/handling, submission of specimen other than nasopharyngeal swab, presence of viral mutation(s) within the areas targeted by this assay, and inadequate number of viral copies(<138 copies/mL). A negative result must be combined with clinical observations, patient history, and epidemiological information. The expected result is Negative.  Fact Sheet for Patients:   BloggerCourse.com  Fact Sheet for Healthcare Providers:  SeriousBroker.it  This test is no t yet approved or cleared by the [place] FDA and  has been authorized for detection and/or diagnosis of SARS-CoV-2 by FDA under an Emergency Use Authorization (EUA). This EUA will remain  in effect (meaning this test can be used) for the duration of the COVID-19 declaration under Section 564(b)(1) of the Act, 21 U.S.C.section 360bbb-3(b)(1), unless the authorization is terminated  or revoked sooner.       Influenza A by PCR POSITIVE (A) NEGATIVE Final   Influenza B by PCR NEGATIVE NEGATIVE Final    Comment: (NOTE) The Xpert Xpress SARS-CoV-2/FLU/RSV plus assay is intended as an aid in the diagnosis of influenza from Nasopharyngeal swab specimens and should not be used as a sole basis for treatment. Nasal washings and aspirates are unacceptable for Xpert Xpress SARS-CoV-2/FLU/RSV testing.  Fact Sheet for Patients: BloggerCourse.com  Fact Sheet for Healthcare Providers: SeriousBroker.it  This test is not yet approved or cleared by the [place] FDA and has been authorized for detection and/or diagnosis of SARS-CoV-2 by FDA under an Emergency Use Authorization (EUA). This EUA will remain in effect (meaning this test can be used) for the duration of the COVID-19 declaration under Section 564(b)(1) of the Act, 21 U.S.C. section 360bbb-3(b)(1), unless the authorization is terminated or revoked.     Resp Syncytial Virus by PCR NEGATIVE NEGATIVE Final    Comment: (NOTE) Fact Sheet for Patients: BloggerCourse.com  Fact Sheet for Healthcare Providers: SeriousBroker.it  This test is not yet approved or cleared by the [place] FDA and has been authorized for detection and/or diagnosis of SARS-CoV-2 by FDA under an Emergency Use  Authorization (EUA). This EUA will remain in effect (meaning this test can be used) for the duration of the COVID-19 declaration under Section 564(b)(1) of the Act, 21 U.S.C. section 360bbb-3(b)(1), unless the authorization is terminated or revoked.  Performed at [facility], [address] [place], [place] [address]   Blood culture (routine x 2)     Status: None (Preliminary result)   Collection Time: 02/18/23  8:43 PM   Specimen: BLOOD  Result Value Ref Range Status   Specimen Description BLOOD BLOOD LEFT HAND  Final   Special Requests   Final    BOTTLES DRAWN AEROBIC AND ANAEROBIC Blood Culture results may not be optimal due to an inadequate volume of blood received in culture bottles   Culture   Final    NO GROWTH < 12 HOURS Performed at [facility], [address] [place], [place] [address]    Report Status PENDING  Incomplete  Blood culture (routine x 2)     Status: None (Preliminary result)   Collection Time:  02/18/23  8:43 PM   Specimen: BLOOD  Result Value Ref Range Status   Specimen Description BLOOD BLOOD RIGHT ARM  Final   Special Requests   Final    BOTTLES DRAWN AEROBIC AND ANAEROBIC Blood Culture adequate volume   Culture   Final    NO GROWTH < 12 HOURS Performed at [facility], [address] [place], [place] [address]    Report Status PENDING  Incomplete    Labs: CBC: Recent Labs  Lab 02/18/23 1330  WBC 10.3  NEUTROABS 7.9*  HGB 15.1  HCT 42.4  MCV 80.5  PLT 283   Basic Metabolic Panel: Recent Labs  Lab 02/18/23 1427  NA 135  K 4.0  CL 103  CO2 21*  GLUCOSE 122*  BUN 17  CREATININE 0.98  CALCIUM 8.1*   Liver Function Tests: No results for input(s): "AST", "ALT", "ALKPHOS", "BILITOT", "PROT", "ALBUMIN" in the last 168 hours. CBG: No results for input(s): "GLUCAP" in the last 168 hours.  Discharge time spent: greater than 30 minutes.  Signed: Jonah Blue, MD [facility] 02/19/2023

## 2023-02-19 NOTE — Hospital Course (Signed)
 Marland Kitchen

## 2023-02-19 NOTE — ED Notes (Signed)
 Assumed patient care and received report from the previous nurse.

## 2023-02-19 NOTE — Progress Notes (Signed)
OT Cancellation Note  Patient Details Name: Dion Sibal MRN: [id number] DOB: [birth year]   Cancelled Treatment:    Reason Eval/Treat Not Completed: OT screened, no needs identified, will sign off. Pt reporting no OT needs, he had already worked with PT this morning and performed all toileting tasks with IND. Per PT, no OT needs either. Will sign off in house and DC orders.  Constance Goltz 02/19/2023, 9:32 AM

## 2023-02-23 LAB — CULTURE, BLOOD (ROUTINE X 2)
Culture: NO GROWTH
Culture: NO GROWTH
Special Requests: ADEQUATE
